# Patient Record
Sex: Male | Born: 1995 | Race: White | Hispanic: No | Marital: Single | Smoking: Former smoker
Health system: Southern US, Community
[De-identification: ages and names within clinical notes are randomized; demographics above are authoritative.]

## PROBLEM LIST (undated history)

## (undated) DIAGNOSIS — F32A Depression, unspecified: Secondary | ICD-10-CM

## (undated) DIAGNOSIS — F329 Major depressive disorder, single episode, unspecified: Secondary | ICD-10-CM

---
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
## 2017-03-30 ENCOUNTER — Emergency Department
Admission: EM | Admit: 2017-03-30 | Discharge: 2017-03-30 | Disposition: A | Discharge status: Home / Self Care | Payer: 59 | Attending: Emergency Medicine | Admitting: Emergency Medicine

## 2017-03-30 ENCOUNTER — Emergency Department: Payer: 59

## 2017-03-30 ENCOUNTER — Encounter: Payer: Self-pay | Admitting: Emergency Medicine

## 2017-03-30 DIAGNOSIS — Y998 Other external cause status: Secondary | ICD-10-CM | POA: Diagnosis not present

## 2017-03-30 DIAGNOSIS — R45851 Suicidal ideations: Secondary | ICD-10-CM | POA: Insufficient documentation

## 2017-03-30 DIAGNOSIS — Z87891 Personal history of nicotine dependence: Secondary | ICD-10-CM | POA: Insufficient documentation

## 2017-03-30 DIAGNOSIS — F3289 Other specified depressive episodes: Secondary | ICD-10-CM | POA: Insufficient documentation

## 2017-03-30 DIAGNOSIS — F10929 Alcohol use, unspecified with intoxication, unspecified: Secondary | ICD-10-CM | POA: Diagnosis not present

## 2017-03-30 DIAGNOSIS — X828XXA Other intentional self-harm by crashing of motor vehicle, initial encounter: Secondary | ICD-10-CM | POA: Diagnosis not present

## 2017-03-30 DIAGNOSIS — Y9389 Activity, other specified: Secondary | ICD-10-CM | POA: Insufficient documentation

## 2017-03-30 DIAGNOSIS — T07XXXA Unspecified multiple injuries, initial encounter: Secondary | ICD-10-CM | POA: Insufficient documentation

## 2017-03-30 DIAGNOSIS — F10920 Alcohol use, unspecified with intoxication, uncomplicated: Secondary | ICD-10-CM

## 2017-03-30 DIAGNOSIS — Y92810 Car as the place of occurrence of the external cause: Secondary | ICD-10-CM | POA: Insufficient documentation

## 2017-03-30 HISTORY — DX: Major depressive disorder, single episode, unspecified: F32.9

## 2017-03-30 HISTORY — DX: Depression, unspecified: F32.A

## 2017-03-30 LAB — CBC
HCT: 44.7 % (ref 40.0–52.0)
HEMOGLOBIN: 15.8 g/dL (ref 13.0–18.0)
MCH: 29.5 pg (ref 26.0–34.0)
MCHC: 35.2 g/dL (ref 32.0–36.0)
MCV: 83.8 fL (ref 80.0–100.0)
Platelets: 304 10*3/uL (ref 150–440)
RBC: 5.34 MIL/uL (ref 4.40–5.90)
RDW: 12.9 % (ref 11.5–14.5)
WBC: 7.5 10*3/uL (ref 3.8–10.6)

## 2017-03-30 LAB — COMPREHENSIVE METABOLIC PANEL
ALBUMIN: 5.4 g/dL — AB (ref 3.5–5.0)
ALT: 19 U/L (ref 17–63)
AST: 26 U/L (ref 15–41)
Alkaline Phosphatase: 67 U/L (ref 38–126)
Anion gap: 12 (ref 5–15)
BUN: 14 mg/dL (ref 6–20)
CHLORIDE: 108 mmol/L (ref 101–111)
CO2: 22 mmol/L (ref 22–32)
CREATININE: 1.13 mg/dL (ref 0.61–1.24)
Calcium: 9.6 mg/dL (ref 8.9–10.3)
GFR calc Af Amer: >60 mL/min (ref >60)
GFR calc non Af Amer: >60 mL/min (ref >60)
GLUCOSE: 108 mg/dL — AB (ref 65–99)
POTASSIUM: 3.6 mmol/L (ref 3.5–5.1)
SODIUM: 142 mmol/L (ref 135–145)
Total Bilirubin: 1.1 mg/dL (ref 0.3–1.2)
Total Protein: 8.3 g/dL — ABNORMAL HIGH (ref 6.5–8.1)

## 2017-03-30 LAB — URINE DRUG SCREEN, QUALITATIVE (ARMC ONLY)
AMPHETAMINES, UR SCREEN: NOT DETECTED
Barbiturates, Ur Screen: NOT DETECTED
Benzodiazepine, Ur Scrn: NOT DETECTED
CANNABINOID 50 NG, UR ~~LOC~~: POSITIVE — AB
COCAINE METABOLITE, UR ~~LOC~~: NOT DETECTED
MDMA (ECSTASY) UR SCREEN: NOT DETECTED
Methadone Scn, Ur: NOT DETECTED
Opiate, Ur Screen: NOT DETECTED
PHENCYCLIDINE (PCP) UR S: NOT DETECTED
Tricyclic, Ur Screen: NOT DETECTED

## 2017-03-30 LAB — URINALYSIS, ROUTINE W REFLEX MICROSCOPIC
BILIRUBIN URINE: NEGATIVE
Glucose, UA: NEGATIVE mg/dL
HGB URINE DIPSTICK: NEGATIVE
Ketones, ur: NEGATIVE mg/dL
Leukocytes, UA: NEGATIVE
NITRITE: NEGATIVE
PH: 7 (ref 5.0–8.0)
Protein, ur: NEGATIVE mg/dL
SPECIFIC GRAVITY, URINE: 1.009 (ref 1.005–1.030)

## 2017-03-30 LAB — SALICYLATE LEVEL: Salicylate Lvl: <7 mg/dL (ref 2.8–30.0)

## 2017-03-30 LAB — ETHANOL: Alcohol, Ethyl (B): 268 mg/dL — ABNORMAL HIGH (ref <5)

## 2017-03-30 LAB — ACETAMINOPHEN LEVEL: Acetaminophen (Tylenol), Serum: <10 ug/mL — ABNORMAL LOW (ref 10–30)

## 2017-03-30 MED ORDER — IBUPROFEN 600 MG PO TABS
600.0000 mg | ORAL_TABLET | Freq: Once | ORAL | Status: AC
  Administered 2017-03-30: 600 mg via ORAL
  Filled 2017-03-30: qty 1

## 2017-03-30 MED ORDER — SODIUM CHLORIDE 0.9 % IV BOLUS (SEPSIS)
1000.0000 mL | Freq: Once | INTRAVENOUS | Status: AC
  Administered 2017-03-30: 1000 mL via INTRAVENOUS

## 2017-03-30 NOTE — ED Notes (Signed)
ED Provider at bedside. 

## 2017-03-30 NOTE — ED Notes (Signed)
Family and officer at bedside.
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
## 2017-03-30 NOTE — ED Notes (Signed)
[facility].  Discussed patient with ER MD (Dr. Shaune PollackLord) and patient medically cleared. Patient was giving referral information and instructions on how to follow up with Outpatient Treatment ([facility], [facility] and [facility]) and [facility].
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
## 2017-03-30 NOTE — ED Notes (Signed)
Verbal order from Dr. Shaune PollackLord to DC sitter

## 2017-03-30 NOTE — ED Notes (Signed)
Patient's mother at bedside.

## 2017-03-30 NOTE — ED Notes (Signed)
Discussed with mother the change out procedures and plan of care.  Agreeable with plan.  Pt is a medical patient at this time.

## 2017-03-30 NOTE — ED Notes (Signed)
Family given visitation information and patient's passcode.  Informed them if the patient is moved out of the ED the visitation and phone hours may vary and it would be up to the patient to inform them that he has moved. Family verbalized understanding of this.

## 2017-03-30 NOTE — ED Notes (Signed)
Dr. Shaune PollackLord in room to update patient of test results and plan of care.
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
## 2017-03-30 NOTE — ED Notes (Signed)
Transported to XR with RN Sunny SchleinFelicia and [employer]

## 2018-11-18 ENCOUNTER — Encounter: Payer: Self-pay | Admitting: Emergency Medicine

## 2018-11-18 ENCOUNTER — Ambulatory Visit
Admission: EM | Admit: 2018-11-18 | Discharge: 2018-11-18 | Disposition: A | Discharge status: Home / Self Care | Payer: 59 | Attending: Family Medicine | Admitting: Family Medicine

## 2018-11-18 ENCOUNTER — Other Ambulatory Visit: Payer: Self-pay

## 2018-11-18 DIAGNOSIS — R05 Cough: Secondary | ICD-10-CM

## 2018-11-18 DIAGNOSIS — R0981 Nasal congestion: Secondary | ICD-10-CM | POA: Diagnosis not present

## 2018-11-18 DIAGNOSIS — Z87891 Personal history of nicotine dependence: Secondary | ICD-10-CM

## 2018-11-18 DIAGNOSIS — J029 Acute pharyngitis, unspecified: Secondary | ICD-10-CM | POA: Diagnosis not present

## 2018-11-18 DIAGNOSIS — J069 Acute upper respiratory infection, unspecified: Secondary | ICD-10-CM

## 2018-11-18 LAB — RAPID STREP SCREEN (MED CTR MEBANE ONLY): Streptococcus, Group A Screen (Direct): NEGATIVE

## 2018-11-18 MED ORDER — NAPROXEN 500 MG PO TABS: 500.0000 mg | ORAL_TABLET | Freq: Two times a day (BID) | ORAL | 0 refills | Status: AC | PRN

## 2018-11-18 MED ORDER — BENZONATATE 100 MG PO CAPS: 100.0000 mg | ORAL_CAPSULE | Freq: Three times a day (TID) | ORAL | 0 refills | Status: AC | PRN

## 2018-11-18 NOTE — ED Provider Notes (Signed)
[facility]    CSN: [id number] Arrival date & time: 11/18/18  1755  History   Chief Complaint Chief Complaint  Patient presents with  . Fever  . Sore Throat  . Cough   HPI   23 year old male presents for lab results.  Sore throat started yesterday.  Reports associated cough and congestion as well as sneezing.  Mild to moderate in severity. No documented fever.  No known exacerbating or relieving factors.  No other associated symptoms.  History reviewed and updated as below.  Past Medical History:  Diagnosis Date  . Depression    History reviewed. No pertinent surgical history.   Home Medications    Prior to Admission medications   Medication Sig Start Date End Date Taking? Authorizing Provider  benzonatate (TESSALON) 100 MG capsule Take 1 capsule (100 mg total) by mouth 3 (three) times daily as needed for cough. 11/18/18   Tommie Sams, DO  naproxen (NAPROSYN) 500 MG tablet Take 1 tablet (500 mg total) by mouth 2 (two) times daily as needed for moderate pain. 11/18/18   Tommie Sams, DO    Family History Family History  Problem Relation Age of Onset  . Healthy Mother   . Healthy Father     Social History Social History   Tobacco Use  . Smoking status: Former Games developer  . Smokeless tobacco: Never Used  Substance Use Topics  . Alcohol use: Yes  . Drug use: No     Allergies   Penicillins and Sulfa antibiotics   Review of Systems Review of Systems  HENT: Positive for congestion, sneezing and sore throat.   Respiratory: Positive for cough.    Physical Exam Triage Vital Signs ED Triage Vitals  Enc Vitals Group     BP 11/18/18 1807 138/86     Pulse Rate 11/18/18 1807 61     Resp 11/18/18 1807 16     Temp 11/18/18 1807 98.5 F (36.9 C)     Temp Source 11/18/18 1807 Oral     SpO2 11/18/18 1807 100 %     Weight 11/18/18 1804 175 lb (79.4 kg)     Height 11/18/18 1804 5\' 7"  (1.702 m)     Head Circumference --      Peak Flow --      Pain  Score 11/18/18 1804 5     Pain Loc --      Pain Edu? --      Excl. in GC? --     Updated Vital Signs BP 138/86 (BP Location: Left Arm)   Pulse 61   Temp 98.5 F (36.9 C) (Oral)   Resp 16   Ht 5\' 7"  (1.702 m)   Wt 79.4 kg   SpO2 100%   BMI 27.41 kg/m   Visual Acuity Right Eye Distance:   Left Eye Distance:   Bilateral Distance:    Right Eye Near:   Left Eye Near:    Bilateral Near:     Physical Exam Vitals signs and nursing note reviewed.  Constitutional:      General: He is not in acute distress.    Appearance: Normal appearance.  HENT:     Head: Normocephalic and atraumatic.     Right Ear: Tympanic membrane normal.     Left Ear: Tympanic membrane normal.     Mouth/Throat:     Pharynx: Oropharynx is clear. No oropharyngeal exudate.  Eyes:     General:        Right eye:  No discharge.        Left eye: No discharge.     Conjunctiva/sclera: Conjunctivae normal.  Cardiovascular:     Rate and Rhythm: Normal rate and regular rhythm.  Pulmonary:     Effort: Pulmonary effort is normal.     Breath sounds: Normal breath sounds.  Neurological:     Mental Status: He is alert.  Psychiatric:        Mood and Affect: Mood normal.        Behavior: Behavior normal.    UC Treatments / Results  Labs (all labs ordered are listed, but only abnormal results are displayed) Labs Reviewed  RAPID STREP SCREEN ([facility] ONLY)  CULTURE, GROUP A STREP [facility])    EKG None  Radiology No results found.  Procedures Procedures (including critical care time)  Medications Ordered in UC Medications - No data to display  Initial Impression / Assessment and Plan / UC Course  I have reviewed the triage vital signs and the nursing notes.  Pertinent labs & imaging results that were available during my care of the patient were reviewed by me and considered in my medical decision making (see chart for details).     23 year old male presents with a viral respiratory  infection.  Tessalon Perles and naproxen as directed.  Supportive care.  Final Clinical Impressions(s) / UC Diagnoses   Final diagnoses:  Viral upper respiratory tract infection   Discharge Instructions   None    ED Prescriptions    Medication Sig Dispense Auth. Provider   naproxen (NAPROSYN) 500 MG tablet Take 1 tablet (500 mg total) by mouth 2 (two) times daily as needed for moderate pain. 30 tablet Saleema Weppler G, DO   benzonatate (TESSALON) 100 MG capsule Take 1 capsule (100 mg total) by mouth 3 (three) times daily as needed for cough. 30 capsule Tommie Sams, DO     Controlled Substance Prescriptions [place] Controlled Substance Registry consulted? Not Applicable   Tommie Sams, DO 11/18/18 2111

## 2018-11-18 NOTE — ED Triage Notes (Signed)
Patient c/o sore throat, fever, and congestion that started yesterday.

## 2018-11-21 LAB — CULTURE, GROUP A STREP (THRC)
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
## 2024-03-28 ENCOUNTER — Ambulatory Visit: Admission: EM | Admit: 2024-03-28 | Discharge: 2024-03-28 | Disposition: A | Discharge status: Home / Self Care

## 2024-03-28 DIAGNOSIS — K649 Unspecified hemorrhoids: Secondary | ICD-10-CM

## 2024-03-28 MED ORDER — HYDROCORTISONE ACETATE 25 MG RE SUPP: 25.0000 mg | Freq: Two times a day (BID) | RECTAL | 0 refills | Status: AC | PRN

## 2024-03-28 NOTE — Discharge Instructions (Addendum)
 Take meds as directed Follow-up with surgery-call for appointment Drink plenty of water, avoid caffeine, take an over-the-counter stool softener, avoid straining Go to ER for new or worsening issues or concerns

## 2024-03-28 NOTE — ED Provider Notes (Signed)
 [facility]    CSN: [id number] Arrival date & time: 03/28/24  1442      History   Chief Complaint Chief Complaint  Patient presents with   Hemorrhoids    HPI Lawrence King is a 28 y.o. male.   28 year old male patient, Lawrence King, presents to urgent care for evaluation of rectal pain ? hemorrhoids x 1 week.  Patient denies constipation or straining, no prior history of hemorrhoids.  States pain is worse whenever he is at work on his feet or  lifting heavy things.  PMH: former smoker, depression  The history is provided by the patient. No language interpreter was used.    Past Medical History:  Diagnosis Date   Depression     Patient Active Problem List   Diagnosis Date Noted   Hemorrhoids 03/28/2024    History reviewed. No pertinent surgical history.     Home Medications    Prior to Admission medications   Medication Sig Start Date End Date Taking? Authorizing Provider  hydrocortisone  (ANUSOL -HC) 25 MG suppository Place 1 suppository (25 mg total) rectally 2 (two) times daily as needed for hemorrhoids or anal itching. 03/28/24  Yes Lawrence King, Rilla, Lawrence King  ibuprofen  (ADVIL ) 200 MG tablet Take 400 mg by mouth every 6 (six) hours as needed for mild pain (pain score 1-3) or moderate pain (pain score 4-6).   Yes [provider]  UNABLE TO FIND Apply topically 2 (two) times daily. Med Name: Preperation H   Yes [provider]  benzonatate  (TESSALON ) 100 MG capsule Take 1 capsule (100 mg total) by mouth 3 (three) times daily as needed for cough. 11/18/18   Cook, Jayce G, DO  naproxen  (NAPROSYN ) 500 MG tablet Take 1 tablet (500 mg total) by mouth 2 (two) times daily as needed for moderate pain. 11/18/18   Cook, Jayce G, DO    Family History Family History  Problem Relation Age of Onset   Healthy Mother    Healthy Father     Social History Social History   Tobacco Use   Smoking status: Former   Smokeless tobacco: Never   Advertising account planner   Vaping status: Former  Substance Use Topics   Alcohol use: Yes   Drug use: No     Allergies   Penicillins and Sulfa antibiotics   Review of Systems Review of Systems  Constitutional:  Negative for fever.  Gastrointestinal:  Positive for rectal pain. Negative for abdominal pain, blood in stool, constipation, nausea and vomiting.  All other systems reviewed and are negative.    Physical Exam Triage Vital Signs ED Triage Vitals  Encounter Vitals Group     BP      Girls Systolic BP Percentile      Girls Diastolic BP Percentile      Boys Systolic BP Percentile      Boys Diastolic BP Percentile      Pulse      Resp      Temp      Temp src      SpO2      Weight      Height      Head Circumference      Peak Flow      Pain Score      Pain Loc      Pain Education      Exclude from Growth Chart    No data found.  Updated Vital Signs BP 122/81   Pulse 74   Temp  99.5 F (37.5 C) (Oral)   Visual Acuity Right Eye Distance:   Left Eye Distance:   Bilateral Distance:    Right Eye Near:   Left Eye Near:    Bilateral Near:     Physical Exam Vitals and nursing note reviewed. Exam conducted with a chaperone present.  Constitutional:      Appearance: Normal appearance. He is well-developed and well-groomed.  Cardiovascular:     Rate and Rhythm: Normal rate.  Pulmonary:     Effort: Pulmonary effort is normal.  Genitourinary:    Rectum: External hemorrhoid present.  Neurological:     General: No focal deficit present.     Mental Status: He is alert and oriented to person, place, and time.  Psychiatric:        Attention and Perception: Attention normal.        Mood and Affect: Mood normal.        Speech: Speech normal.        Behavior: Behavior is cooperative.      UC Treatments / Results  Labs (all labs ordered are listed, but only abnormal results are displayed) Labs Reviewed - No data to display  EKG   Radiology No results  found.  Procedures Procedures (including critical care time)  Medications Ordered in UC Medications - No data to display  Initial Impression / Assessment and Plan / UC Course  I have reviewed the triage vital signs and the nursing notes.  Pertinent labs & imaging results that were available during my care of the patient were reviewed by me and considered in my medical decision making (see chart for details).    Discussed exam findings and plan of care with patient, strict go to ER precautions given.   Patient verbalized understanding to this provider.  Ddx: Hemorrhoids, rectal pain Final Clinical Impressions(s) / UC Diagnoses   Final diagnoses:  Hemorrhoids, unspecified hemorrhoid type     Discharge Instructions      Take meds as directed Follow-up with surgery-call for appointment Drink plenty of water, avoid caffeine, take an over-the-counter stool softener, avoid straining Go to ER for new or worsening issues or concerns     ED Prescriptions     Medication Sig Dispense Auth. Provider   hydrocortisone  (ANUSOL -HC) 25 MG suppository Place 1 suppository (25 mg total) rectally 2 (two) times daily as needed for hemorrhoids or anal itching. 12 suppository Lawrence King, Rilla, Lawrence King      PDMP not reviewed this encounter.   Aminta Rilla, Lawrence King 03/28/24 1557

## 2024-03-28 NOTE — ED Triage Notes (Signed)
 Patient noted swelling and pain at rectum x 1 week ago.  Denies bleeding with BM.  Denies constipation or straining to go.  No history of hemorrhoids.
